# Patient Record
Sex: Female | Born: 1940 | Race: White | Hispanic: Yes | Marital: Married | Smoking: Never smoker
Health system: Southern US, Community
[De-identification: ages and names within clinical notes are randomized; demographics above are authoritative.]

## PROBLEM LIST (undated history)

## (undated) DIAGNOSIS — T7840XA Allergy, unspecified, initial encounter: Secondary | ICD-10-CM

## (undated) DIAGNOSIS — K219 Gastro-esophageal reflux disease without esophagitis: Secondary | ICD-10-CM

## (undated) DIAGNOSIS — I10 Essential (primary) hypertension: Secondary | ICD-10-CM

## (undated) HISTORY — DX: Essential (primary) hypertension: I10

## (undated) HISTORY — DX: Gastro-esophageal reflux disease without esophagitis: K21.9

## (undated) HISTORY — DX: Allergy, unspecified, initial encounter: T78.40XA

## (undated) HISTORY — PX: APPENDECTOMY: SHX54

## (undated) HISTORY — PX: CHOLECYSTECTOMY: SHX55

## (undated) HISTORY — PX: TUBAL LIGATION: SHX77

---

## 2020-10-19 ENCOUNTER — Other Ambulatory Visit: Payer: Self-pay

## 2020-10-20 ENCOUNTER — Ambulatory Visit (INDEPENDENT_AMBULATORY_CARE_PROVIDER_SITE_OTHER): Payer: Medicare Other | Admitting: Family Medicine

## 2020-10-20 ENCOUNTER — Encounter: Payer: Self-pay | Admitting: Family Medicine

## 2020-10-20 VITALS — BP 120/78 | HR 62 | Temp 98.0°F | Ht 58.5 in | Wt 137.5 lb

## 2020-10-20 DIAGNOSIS — I10 Essential (primary) hypertension: Secondary | ICD-10-CM | POA: Diagnosis not present

## 2020-10-20 DIAGNOSIS — Z23 Encounter for immunization: Secondary | ICD-10-CM | POA: Diagnosis not present

## 2020-10-20 NOTE — Progress Notes (Signed)
[facility] [address] [place] [address] Dept: [phone] Dept Fax: [phone]  New Patient Office Visit  Subjective:    Patient ID: Neita Carp, female    DOB: 1941/03/12, 80 y.o..   MRN: [id number]  Chief Complaint  Patient presents with   Establish Care    NP Establish care.  No concerns.     History of Present Illness:  Patient is in today to establish care. Ms. Yeagle is originally from [place]. She lived in [place], [place] most recently. She and her husband moved to [place] 2 months ago to be closer to her kids. They have three children and 8 grandchildren. She is retired from doing office work. She is still looking for how to become more socially engaged in her new community. She denies tobacco use. She drinks wine with dinner and has an occasional cocktail. She denies any drug use.  Ms. Rooks has  a history of essential hypertension. She has noted a gradual increase in the number of medications needed to control her BP, but it has been doing well more recently.  Ms. Sassano had a tetanus shot earlier this year (~ Jan.) She had shingles vaccinations some years ago. She notes she had her last DEXA scan abotu 1.5 to 2 years ago. She also last had blood work around that time. She does not recall ever having a pneumococcal vaccine.  Past Medical History: Patient Active Problem List   Diagnosis Date Noted   Essential hypertension 10/20/2020   Past Surgical History:  Procedure Laterality Date   APPENDECTOMY     CHOLECYSTECTOMY     TUBAL LIGATION Bilateral    Family History  Problem Relation Age of Onset   Heart disease Mother    Diabetes Mother    Cirrhosis Mother    Heart disease Father    Kidney disease Brother    Heart disease Brother    Diabetes Brother    Outpatient Medications Prior to Visit  Medication Sig Dispense Refill   amLODipine (NORVASC) 5 MG tablet Take 1 tablet by mouth daily.      atenolol (TENORMIN) 50 MG tablet Take 50 mg by mouth daily.     benazepril (LOTENSIN) 10 MG tablet Take 10 mg by mouth daily.     No facility-administered medications prior to visit.   No Known Allergies    Objective:   Today's Vitals   10/20/20 1440  BP: 120/78  Pulse: 62  Temp: 98 F (36.7 C)  TempSrc: Temporal  SpO2: 97%  Weight: 137 lb 8 oz (62.4 kg)  Height: 4' 10.5" (1.486 m)   Body mass index is 28.25 kg/m.   General: Well developed, well nourished. No acute distress. Psych: Alert and oriented x3. Normal mood and affect.  Health Maintenance Due  Topic Date Due   TETANUS/TDAP  Never done   Zoster Vaccines- Shingrix (1 of 2) Never done   DEXA SCAN  Never done   PNA vac Low Risk Adult (1 of 2 - PCV13) Never done   COVID-19 Vaccine (4 - Booster for Pfizer series) 07/11/2020     Assessment & Plan:   1. Essential hypertension Ms. Kaylor blood pressure is in good control today. I will continue her on amlodipine, atenolol, and benazepril. I will plan to see her again in 3 months. At that visit, we will plan to screen for fasting lipids and diabetes. We will consider referral for DEXA scanning. We did discuss potentially continuing colorectal cancer  screening, but she declines today.  Loyola Mast, MD

## 2020-10-31 ENCOUNTER — Telehealth: Payer: Self-pay

## 2020-10-31 MED ORDER — BENAZEPRIL HCL 10 MG PO TABS: 10.0000 mg | ORAL_TABLET | Freq: Every day | ORAL | 3 refills | Status: DC

## 2020-10-31 MED ORDER — AMLODIPINE BESYLATE 5 MG PO TABS: 5.0000 mg | ORAL_TABLET | Freq: Every day | ORAL | 3 refills | Status: DC

## 2020-10-31 MED ORDER — ATENOLOL 50 MG PO TABS: 50.0000 mg | ORAL_TABLET | Freq: Every day | ORAL | 3 refills | Status: DC

## 2020-10-31 NOTE — Telephone Encounter (Signed)
please review refill request and advise.  Thanks.   Dm/cma

## 2020-10-31 NOTE — Telephone Encounter (Signed)
New message    1. Which medications need to be refilled? (please list name of each medication and dose if known)  amLODipine (NORVASC) 5 MG tablet  atenolol (TENORMIN) 50 MG tablet  benazepril (LOTENSIN) 10 MG tablet  2. Which pharmacy/location (including street and city if local pharmacy) is medication to be sent to?[facility] - [place], [place] - [address]  3. Do they need a 30 day or 90 day supply? 3 months supply

## 2020-11-01 NOTE — Telephone Encounter (Signed)
Lft detailed VM that RX's were sent to mail order. Dm/cma

## 2021-01-19 ENCOUNTER — Encounter: Payer: Self-pay | Admitting: Family Medicine

## 2021-01-19 ENCOUNTER — Ambulatory Visit (INDEPENDENT_AMBULATORY_CARE_PROVIDER_SITE_OTHER): Payer: Medicare Other | Admitting: Family Medicine

## 2021-01-19 ENCOUNTER — Other Ambulatory Visit: Payer: Self-pay

## 2021-01-19 VITALS — BP 140/78 | HR 55 | Temp 97.4°F | Ht 58.5 in | Wt 137.4 lb

## 2021-01-19 DIAGNOSIS — M1712 Unilateral primary osteoarthritis, left knee: Secondary | ICD-10-CM | POA: Diagnosis not present

## 2021-01-19 DIAGNOSIS — I10 Essential (primary) hypertension: Secondary | ICD-10-CM | POA: Diagnosis not present

## 2021-01-19 NOTE — Progress Notes (Signed)
  [facility] [address] [place] [address] Dept: [phone] Dept Fax: [phone]  Chronic Care Office Visit  Subjective:    Patient ID: Frances Hogan, female    DOB: 01-18-1941, 80 y.o..   MRN: [id number]  Chief Complaint  Patient presents with   Follow-up    3 mo f/u HTN. At home BP range 107/70-123/80    History of Present Illness:  Patient is in today for reassessment of chronic medical issues.  Frances Hogan has a history of hypertension. She is currently managed on amlodipine, atenolol, and benazepril. Her home blood pressures range 107-123/70-80. She tries to remain active, typically walking up to 2 miles a day and [facility] [employer] She has been noticing some pain in her left knee over the past couple of months. She takes Aleve, which helps, but has not resolved the issue. She denies any past trauma to the knee.  Past Medical History: Patient Active Problem List   Diagnosis Date Noted   Essential hypertension 10/20/2020   Past Surgical History:  Procedure Laterality Date   APPENDECTOMY     CHOLECYSTECTOMY     TUBAL LIGATION Bilateral    Family History  Problem Relation Age of Onset   Heart disease Mother    Diabetes Mother    Cirrhosis Mother    Heart disease Father    Kidney disease Brother    Heart disease Brother    Diabetes Brother    Outpatient Medications Prior to Visit  Medication Sig Dispense Refill   amLODipine (NORVASC) 5 MG tablet Take 1 tablet (5 mg total) by mouth daily. 90 tablet 3   atenolol (TENORMIN) 50 MG tablet Take 1 tablet (50 mg total) by mouth daily. 90 tablet 3   benazepril (LOTENSIN) 10 MG tablet Take 1 tablet (10 mg total) by mouth daily. 90 tablet 3   No facility-administered medications prior to visit.   No Known Allergies    Objective:   Today's Vitals   01/19/21 1109  BP: 140/78  Pulse: (!) 55  Temp: (!) 97.4 F (36.3 C)  TempSrc: Temporal   SpO2: 97%  Weight: 137 lb 6.4 oz (62.3 kg)  Height: 4' 10.5" (1.486 m)   Body mass index is 28.23 kg/m.   General: Well developed, well nourished. No acute distress. Extremities: The left knee is mildly enlarged compared to the right. There is no increased warmth. Full ROM   with mild popping noted laterally. Varus/valgus testing and Lochman's are negative. McMurray's testing   produces both medial and lateral popping.  Psych: Alert and oriented x3. Normal mood and affect.  Health Maintenance Due  Topic Date Due   DEXA SCAN  Never done   PNA vac Low Risk Adult (1 of 2 - PCV13) 06/18/2004   COVID-19 Vaccine (4 - Booster for Pfizer series) 07/11/2020   INFLUENZA VACCINE  Never done     Assessment & Plan:   1. Essential hypertension Blood pressure is good at home. We will continue her on amlodipine, atenolol, and benazepril. I will reassess her in 3 months.  2. Arthritis of left knee Frances Hogan likely has some osteoarthritis with possible meniscal issues. I will refer her to sports medicine for an assessment and management options. She is not interested in surgery.  - Ambulatory referral to Sports Medicine  Loyola Mast, MD

## 2021-01-22 ENCOUNTER — Ambulatory Visit: Payer: Medicare Other | Admitting: Family Medicine

## 2021-01-22 NOTE — Progress Notes (Signed)
Subjective:    CC: L knee pain  I, Frances Hogan, LAT, ATC, am serving as scribe for Dr. Clementeen Graham.  HPI: Pt is an 80 y/o female presenting w/ c/o chronic L knee pain x 5 years that has been worsening over the last 2 years.  She locates her pain to her L medial knee.  Her goals are to continue to walk for exercise and to stay fit and active.  She would like to avoid a knee replacement as her siblings have had one and not done well.  L knee swelling: yes L knee mechanical symptoms: no Aggravating factors: walking; worsens throughout the day Treatments tried: Aleve; knee sleeve; Tylenol; Osteobiflex  Pertinent review of Systems: No fevers or chills  Relevant historical information: Hypertension   Objective:    Vitals:   01/23/21 0921  BP: (!) 144/82  Pulse: (!) 58  SpO2: 97%   General: Well Developed, well nourished, and in no acute distress.   MSK: Left knee mild knee effusion bossing medial joint line Normal knee motion with crepitation.   Slight laxity MCL stress test. Intact strength. Negative McMurray's test.   Lab and Radiology Results  Diagnostic Limited MSK Ultrasound of: Left knee Quad tendon intact normal-appearing Trace joint effusion present. Patellar tendon normal-appearing Medial joint line narrowed degenerative with extruded medial meniscus. Lateral joint line normal. Posterior knee no Baker's cyst. 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  X-ray images left knee obtained today personally and independently interpreted Moderate medial compartment DJD.  Mild patellofemoral DJD.  Chondrocalcinosis present. Await formal radiology review   Impression and Recommendations:    Assessment and Plan: 80 y.o. female with left knee pain primarily due to medial compartment DJD.  Patient does have some chondrocalcinosis on x-ray however pseudogout I think is pretty unlikely.  Believe source of chondrocalcinosis is due to  the DJD.  Discussed treatment plan and options.  Fundamentally she would like to pursue further conservative management strategies.  Plan to use Tylenol arthritis especially in the evening when her knee starts to get more painful predictably, Voltaren gel, and compression knee sleeve.  We discussed the potential of a medial off loader knee brace which should help her medial compartment DJD and the instability seen on exam.  She would like to avoid a big bulky knee brace for now.  Additionally quad strengthening should be helpful.  She like to work on that on her own but physical therapy is an option in the future.  Additionally we discussed steroid knee injections and hyaluronic acid injections.  She would like to delay these as well.  Lastly knee replacement is certainly an option at some point.  She is not ready to consider this.  Check back with me as needed.  Certainly more to offer in the future.  Fundamentally she seems to be in pretty good shape and I think will do well.Marland Kitchen  PDMP not reviewed this encounter. Orders Placed This Encounter  Procedures   [place] LIMITED JOINT SPACE STRUCTURES LOW LEFT(NO LINKED CHARGES)    Order Specific Question:   Reason for Exam (SYMPTOM  OR DIAGNOSIS REQUIRED)    Answer:   L knee pain    Order Specific Question:   Preferred imaging location?    Answer:   [facility] Knee AP/LAT W/[place] Left    Standing Status:   Future    Number of Occurrences:   1    Standing Expiration Date:  02/22/2021    Order Specific Question:   Reason for Exam (SYMPTOM  OR DIAGNOSIS REQUIRED)    Answer:   L knee pain    Order Specific Question:   Preferred imaging location?    Answer:   [facility]   No orders of the defined types were placed in this encounter.   Discussed warning signs or symptoms. Please see discharge instructions. Patient expresses understanding.   The above documentation has been reviewed and is accurate and complete  Clementeen Graham, M.D.

## 2021-01-23 ENCOUNTER — Ambulatory Visit (INDEPENDENT_AMBULATORY_CARE_PROVIDER_SITE_OTHER): Payer: Medicare Other

## 2021-01-23 ENCOUNTER — Ambulatory Visit (INDEPENDENT_AMBULATORY_CARE_PROVIDER_SITE_OTHER): Payer: Medicare Other | Admitting: Family Medicine

## 2021-01-23 ENCOUNTER — Encounter: Payer: Self-pay | Admitting: Family Medicine

## 2021-01-23 ENCOUNTER — Other Ambulatory Visit: Payer: Self-pay

## 2021-01-23 ENCOUNTER — Ambulatory Visit: Payer: Self-pay

## 2021-01-23 VITALS — BP 144/82 | HR 58 | Ht 58.5 in | Wt 138.4 lb

## 2021-01-23 DIAGNOSIS — G8929 Other chronic pain: Secondary | ICD-10-CM

## 2021-01-23 DIAGNOSIS — M25562 Pain in left knee: Secondary | ICD-10-CM

## 2021-01-23 DIAGNOSIS — M1712 Unilateral primary osteoarthritis, left knee: Secondary | ICD-10-CM | POA: Insufficient documentation

## 2021-01-23 DIAGNOSIS — M11262 Other chondrocalcinosis, left knee: Secondary | ICD-10-CM | POA: Diagnosis not present

## 2021-01-23 NOTE — Patient Instructions (Signed)
Nice to meet you today.  Please get an Xray today before you leave.   Please use Voltaren gel (Generic Diclofenac Gel) up to 4x daily for pain as needed.  This is available over-the-counter as both the name brand Voltaren gel and the generic diclofenac gel.   Take Tylenol arthritis.  Stay active.  Follow-up as needed.

## 2021-01-24 NOTE — Progress Notes (Signed)
Left knee x-ray shows medium arthritis changes in the knee.

## 2021-04-09 ENCOUNTER — Telehealth: Payer: Self-pay | Admitting: Family Medicine

## 2021-04-09 NOTE — Telephone Encounter (Signed)
Left message for patient to call back to schedule Medicare Annual Wellness Visit   No hx of AWV eligible as of 12/04/20  Please schedule at anytime with [facility] Health Advisor if patient calls the office back.    45 Minutes appointment   Any questions, please call me at [phone]

## 2021-04-19 ENCOUNTER — Other Ambulatory Visit: Payer: Self-pay

## 2021-04-20 ENCOUNTER — Ambulatory Visit (INDEPENDENT_AMBULATORY_CARE_PROVIDER_SITE_OTHER): Payer: Medicare Other | Admitting: Family Medicine

## 2021-04-20 VITALS — BP 130/72 | HR 53 | Temp 96.8°F | Ht <= 58 in | Wt 140.0 lb

## 2021-04-20 DIAGNOSIS — I10 Essential (primary) hypertension: Secondary | ICD-10-CM | POA: Diagnosis not present

## 2021-04-20 DIAGNOSIS — G8929 Other chronic pain: Secondary | ICD-10-CM | POA: Diagnosis not present

## 2021-04-20 DIAGNOSIS — M25562 Pain in left knee: Secondary | ICD-10-CM | POA: Diagnosis not present

## 2021-04-20 NOTE — Progress Notes (Signed)
°  [facility] [address] [place] [address] Dept: [phone] Dept Fax: [phone]  Chronic Care Office Visit  Subjective:    Patient ID: Frances Hogan, female    DOB: 1940/12/25, 80 y.o..   MRN: [id number]  Chief Complaint  Patient presents with   Follow-up    3 mth f/u. No other concerns     History of Present Illness:  Patient is in today for reassessment of chronic medical issues.  Frances Hogan has a history of hypertension. She is currently managed on amlodipine, atenolol, and benazepril. She continues to go for walks, but finds her left knee gives her trouble with this. She was seen by Dr. Denyse Amass, who recommended that we monitor this for now.   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 90 tablet 3   atenolol (TENORMIN) 50 MG tablet Take 1 tablet (50 mg total) by mouth daily. 90 tablet 3   benazepril (LOTENSIN) 10 MG tablet Take 1 tablet (10 mg total) by mouth daily. 90 tablet 3   No facility-administered medications prior to visit.   No Known Allergies    Objective:   Today's Vitals   04/20/21 0940  BP: 130/72  Pulse: (!) 53  Temp: (!) 96.8 F (36 C)  SpO2: 97%  Weight: 140 lb (63.5 kg)   Height: 4\' 10"  (1.473 m)   Body mass index is 29.26 kg/m.   General: Well developed, well nourished. No acute distress. Psych: Alert and oriented. Normal mood and affect.  Health Maintenance Due  Topic Date Due   COVID-19 Vaccine (4 - Booster for Pfizer series) 05/08/2020     Assessment & Plan:   1. Essential hypertension Blood pressure remains at goal. I will continue amlodipine, atenolol, and benazepril. Follow-up in 4 months.  2. Chronic pain of left knee Stable. We discussed alternative approaches to exercise (such as pool exercises), but she is not interested in that.  07/06/2020, MD

## 2021-05-25 ENCOUNTER — Telehealth: Payer: Self-pay | Admitting: Family Medicine

## 2021-05-25 NOTE — Telephone Encounter (Signed)
Left message for patient to call back and schedule Medicare Annual Wellness Visit (AWV) in office.  ° °If not able to come in office, please offer to do virtually or by telephone.  Left office number and my jabber #[phone]. ° °Due for AWVI ° °Please schedule at anytime with Nurse Health Advisor. °  °

## 2021-08-08 ENCOUNTER — Ambulatory Visit: Payer: Medicare Other

## 2022-01-08 ENCOUNTER — Other Ambulatory Visit: Payer: Self-pay | Admitting: Family Medicine

## 2022-03-04 ENCOUNTER — Telehealth: Payer: Self-pay | Admitting: Family Medicine

## 2022-03-04 NOTE — Telephone Encounter (Signed)
Left VM to rtn call to schedule on appointment.   LOV 04/20/21, no Future OV's scheduled. Dm/cma

## 2022-03-04 NOTE — Telephone Encounter (Signed)
Caller Name: Latiya Call back phone #: [phone]   MEDICATION(S):  amLODipine,atenolol, and benazepril  Days of Med Remaining:   Has the patient contacted their pharmacy (YES/NO)? no What did pharmacy advise?   Preferred Pharmacy:  [facility] [place] Phone:  [phone]      ~~~Please advise patient/caregiver to allow 2-3 business days to process RX refills.

## 2022-03-04 NOTE — Telephone Encounter (Signed)
Caller Name: pt Call back phone #: [phone]   MEDICATION(S):  amLODipine (NORVASC) 5 MG tablet [[id number]], atenolol (TENORMIN) 50 MG tablet [[id number]] and benazepril (LOTENSIN) 10 MG tablet [[id number] Days of Med Remaining: 7 days left   Has the patient contacted their pharmacy (YES/NO)? Yes contact your PCP   Preferred Pharmacy:  [facility] [place]  [address], [place] [address]  Phone:  [phone]  Fax:  [phone]

## 2022-03-04 NOTE — Telephone Encounter (Signed)
Left VM to rtn call to schedule on appointment.   LOV 04/20/21, no Future OV's scheduled. Dm/cma 

## 2022-03-07 MED ORDER — BENAZEPRIL HCL 10 MG PO TABS: 10.0000 mg | ORAL_TABLET | Freq: Every day | ORAL | 0 refills | Status: DC

## 2022-03-07 MED ORDER — ATENOLOL 50 MG PO TABS: 50.0000 mg | ORAL_TABLET | Freq: Every day | ORAL | 0 refills | Status: DC

## 2022-03-07 MED ORDER — AMLODIPINE BESYLATE 5 MG PO TABS: 5.0000 mg | ORAL_TABLET | Freq: Every day | ORAL | 0 refills | Status: DC

## 2022-03-07 NOTE — Telephone Encounter (Signed)
Patient  cam into the office and already has an appointment scheduled for 03/12/22.  30 days worth of medication sent to the [facility] for her.  Dm/cma

## 2022-03-07 NOTE — Addendum Note (Signed)
Addended by: Konrad Saha on: 03/07/2022 12:03 PM   Modules accepted: Orders

## 2022-03-12 ENCOUNTER — Encounter: Payer: Self-pay | Admitting: Family Medicine

## 2022-03-12 ENCOUNTER — Ambulatory Visit (INDEPENDENT_AMBULATORY_CARE_PROVIDER_SITE_OTHER): Payer: Medicare Other | Admitting: Family Medicine

## 2022-03-12 VITALS — BP 116/68 | HR 69 | Temp 97.1°F | Ht <= 58 in | Wt 142.4 lb

## 2022-03-12 DIAGNOSIS — M1712 Unilateral primary osteoarthritis, left knee: Secondary | ICD-10-CM

## 2022-03-12 DIAGNOSIS — I10 Essential (primary) hypertension: Secondary | ICD-10-CM

## 2022-03-12 MED ORDER — AMLODIPINE BESYLATE 5 MG PO TABS: 5.0000 mg | ORAL_TABLET | Freq: Every day | ORAL | 3 refills | Status: DC

## 2022-03-12 MED ORDER — ATENOLOL 50 MG PO TABS: 50.0000 mg | ORAL_TABLET | Freq: Every day | ORAL | 3 refills | Status: DC

## 2022-03-12 MED ORDER — BENAZEPRIL HCL 10 MG PO TABS: 10.0000 mg | ORAL_TABLET | Freq: Every day | ORAL | 3 refills | Status: DC

## 2022-03-12 NOTE — Progress Notes (Signed)
  [facility] [address] [place] [address] Dept: [phone] Dept Fax: [phone]  Chronic Care Office Visit  Subjective:    Patient ID: Frances Hogan, female    DOB: 1940-09-26, 81 y.o..   MRN: [id number]  Chief Complaint  Patient presents with   Follow-up    F/u meds. No concerns.     History of Present Illness:  Patient is in today for reassessment of chronic medical issues.  Frances Hogan has a history of hypertension. She is currently managed on amlodipine 5 mg daily, atenolol 50 mg daily, and benazepril 10 mg daily.   Frances Hogan has a history of left knee osteoarthritis. She was seen by Dr. Georgina Snell, who recommended that we monitor this for now.  She feels that this is not worse over the last year.  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 30 tablet 0   atenolol (TENORMIN) 50 MG tablet Take 1 tablet (50 mg total) by mouth daily. 30 tablet 0   benazepril (LOTENSIN) 10 MG tablet Take 1 tablet (10 mg total) by mouth daily. 30 tablet 0   No facility-administered medications prior to visit.   No Known Allergies    Objective:   Today's Vitals   03/12/22 1342  BP: 116/68  Pulse: 69  Temp: (!) 97.1 F (36.2 C)  TempSrc: Temporal  SpO2: 95%   Weight: 142 lb 6.4 oz (64.6 kg)  Height: 4\' 10"  (1.473 m)   Body mass index is 29.76 kg/m.   General: Well developed, well nourished. No acute distress. Psych: Alert and oriented. Normal mood and affect.  Health Maintenance Due  Topic Date Due   Medicare Annual Wellness (AWV)  Never done     Assessment & Plan:   1. Essential hypertension Frances Hogan blood pressure remains in excellent control. We will continue her current meds.  - amLODipine (NORVASC) 5 MG tablet; Take 1 tablet (5 mg total) by mouth daily.  Dispense: 90 tablet; Refill: 3 - atenolol (TENORMIN) 50 MG tablet; Take 1 tablet (50 mg total) by mouth daily.  Dispense: 90 tablet; Refill: 3 - benazepril (LOTENSIN) 10 MG tablet; Take 1 tablet (10 mg total) by mouth daily.  Dispense: 90 tablet; Refill: 3  2. Primary osteoarthritis of left knee Frances Hogan is remaining active and tolerating her arthritic complaints well.  Return in about 1 year (around 03/13/2023).   Haydee Salter, MD

## 2022-07-02 ENCOUNTER — Ambulatory Visit (INDEPENDENT_AMBULATORY_CARE_PROVIDER_SITE_OTHER): Payer: Medicare Other | Admitting: Family Medicine

## 2022-07-02 ENCOUNTER — Encounter: Payer: Self-pay | Admitting: Family Medicine

## 2022-07-02 VITALS — BP 136/80 | HR 64 | Temp 97.6°F | Ht <= 58 in | Wt 143.6 lb

## 2022-07-02 DIAGNOSIS — I10 Essential (primary) hypertension: Secondary | ICD-10-CM | POA: Diagnosis not present

## 2022-07-02 DIAGNOSIS — Z13220 Encounter for screening for lipoid disorders: Secondary | ICD-10-CM

## 2022-07-02 DIAGNOSIS — L309 Dermatitis, unspecified: Secondary | ICD-10-CM | POA: Diagnosis not present

## 2022-07-02 NOTE — Assessment & Plan Note (Signed)
Blood pressure is mildly elevated today, which has not been her norm. I will monitor this for now, before considering a dosage change. I will check her renal function.  She will continue amlodipine 5 mg daily, atenolol 50 mg daily, and benazepril 10 mg daily.

## 2022-07-02 NOTE — Progress Notes (Signed)
[facility] [address] [place] [address] Dept: [phone] Dept Fax: [phone]  Chronic Care Office Visit  Subjective:    Patient ID: Frances Hogan, female    DOB: 1940/07/02, 82 y.o..   MRN: [id number]  Chief Complaint  Patient presents with   Rash    C/o having a rash on legs x 1 month.      History of Present Illness:  Patient is in today for reassessment of chronic medical issues.  [name] has a history of hypertension. She is currently managed on amlodipine 5 mg daily, atenolol 50 mg daily, and benazepril 10 mg daily.    [name] notes that she ahs a rash that developed over the past month on her lower legs. This has not been pruritic. She is concerned about the appearance of the rash.  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 90 tablet 3   atenolol (TENORMIN) 50 MG tablet Take 1 tablet (50 mg total) by mouth daily. 90 tablet 3   benazepril (LOTENSIN) 10 MG tablet Take 1 tablet (10 mg total) by mouth daily. 90 tablet 3   No facility-administered medications prior to visit.   No Known Allergies Objective:   Today's Vitals   07/02/22 1326  BP: 136/80  Pulse: 64  Temp: 97.6 F (36.4 C)  TempSrc: Temporal  SpO2: 98%   Weight: 143 lb 9.6 oz (65.1 kg)  Height: '4\' 10"'$  (1.473 m)   Body mass index is 30.01 kg/m.   General: Well developed, well nourished. No acute distress. Skin: Warm and dry. There is a region around both ankles with brown, speckled rash. There is no erythema and these areas are   non-blanchable. Neuro: CN II-XII intact. Normal sensation and DTR bilaterally. Psych: Alert and oriented. Normal mood and affect.  Health Maintenance Due  Topic Date Due   Medicare Annual Wellness (AWV)  Never done   DEXA SCAN  Never done     Assessment & Plan:   Problem List Items Addressed This Visit       Cardiovascular and Mediastinum   Essential hypertension - Primary    Blood pressure is mildly elevated today, which has not been her norm. I will monitor this for now, before considering a dosage change. I will check her renal function.  She will continue amlodipine 5 mg daily, atenolol 50 mg daily, and benazepril 10 mg daily.      Relevant Orders   Basic metabolic panel   Other Visit Diagnoses     Dermatitis       I suspect this is an age-related hyperpigmentation. However, in light of her concerns for the cosmetic appearance, I will refer her to dermatology.  Relevant Orders   Ambulatory referral to Dermatology   Screening for lipid disorders       Relevant Orders   Lipid panel       Return in about 3 months (around 09/30/2022) for Reassessment.   Haydee Salter, MD

## 2022-07-09 ENCOUNTER — Other Ambulatory Visit: Payer: Medicare Other

## 2022-07-09 ENCOUNTER — Telehealth: Payer: Self-pay | Admitting: Family Medicine

## 2022-07-09 LAB — BASIC METABOLIC PANEL
BUN: 13 mg/dL (ref 6–23)
CO2: 30 mEq/L (ref 19–32)
Calcium: 9.7 mg/dL (ref 8.4–10.5)
Chloride: 102 mEq/L (ref 96–112)
Creatinine, Ser: 0.71 mg/dL (ref 0.40–1.20)
GFR: 78.83 mL/min (ref >60.00)
Glucose, Bld: 89 mg/dL (ref 70–99)
Potassium: 4.6 mEq/L (ref 3.5–5.1)
Sodium: 138 mEq/L (ref 135–145)

## 2022-07-09 LAB — LIPID PANEL
Cholesterol: 215 mg/dL — ABNORMAL HIGH (ref 0–200)
HDL: 73.4 mg/dL (ref >39.00)
LDL Cholesterol: 114 mg/dL — ABNORMAL HIGH (ref 0–99)
NonHDL: 141.43
Total CHOL/HDL Ratio: 3
Triglycerides: 137 mg/dL (ref 0.0–149.0)
VLDL: 27.4 mg/dL (ref 0.0–40.0)

## 2022-07-09 NOTE — Telephone Encounter (Signed)
I called pt to let her know her referral is  [facility] Dermatology Department Contact Information   Phone Fax Address [phone] [address] [place] [address]

## 2022-08-22 ENCOUNTER — Ambulatory Visit: Payer: Medicare Other | Admitting: Dermatology

## 2022-09-15 IMAGING — DX DG KNEE AP/LAT W/ SUNRISE*L*
3 series · 3 of 3 positions shown · non-contrast
Comparison: None.

CLINICAL DATA: Left knee pain.

EXAM:
LEFT KNEE 3 VIEWS

[knee ap]
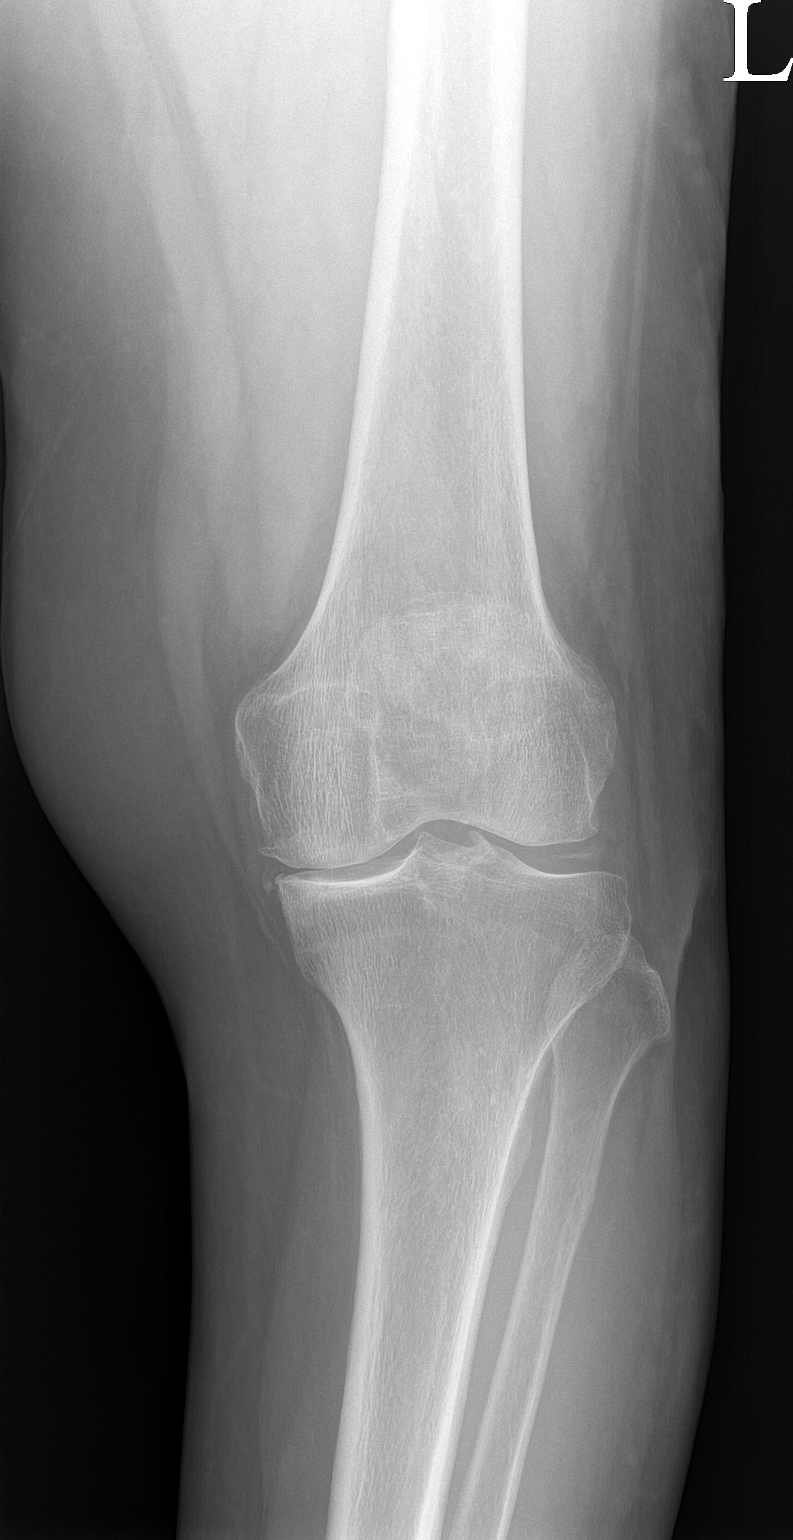

[knee lat]
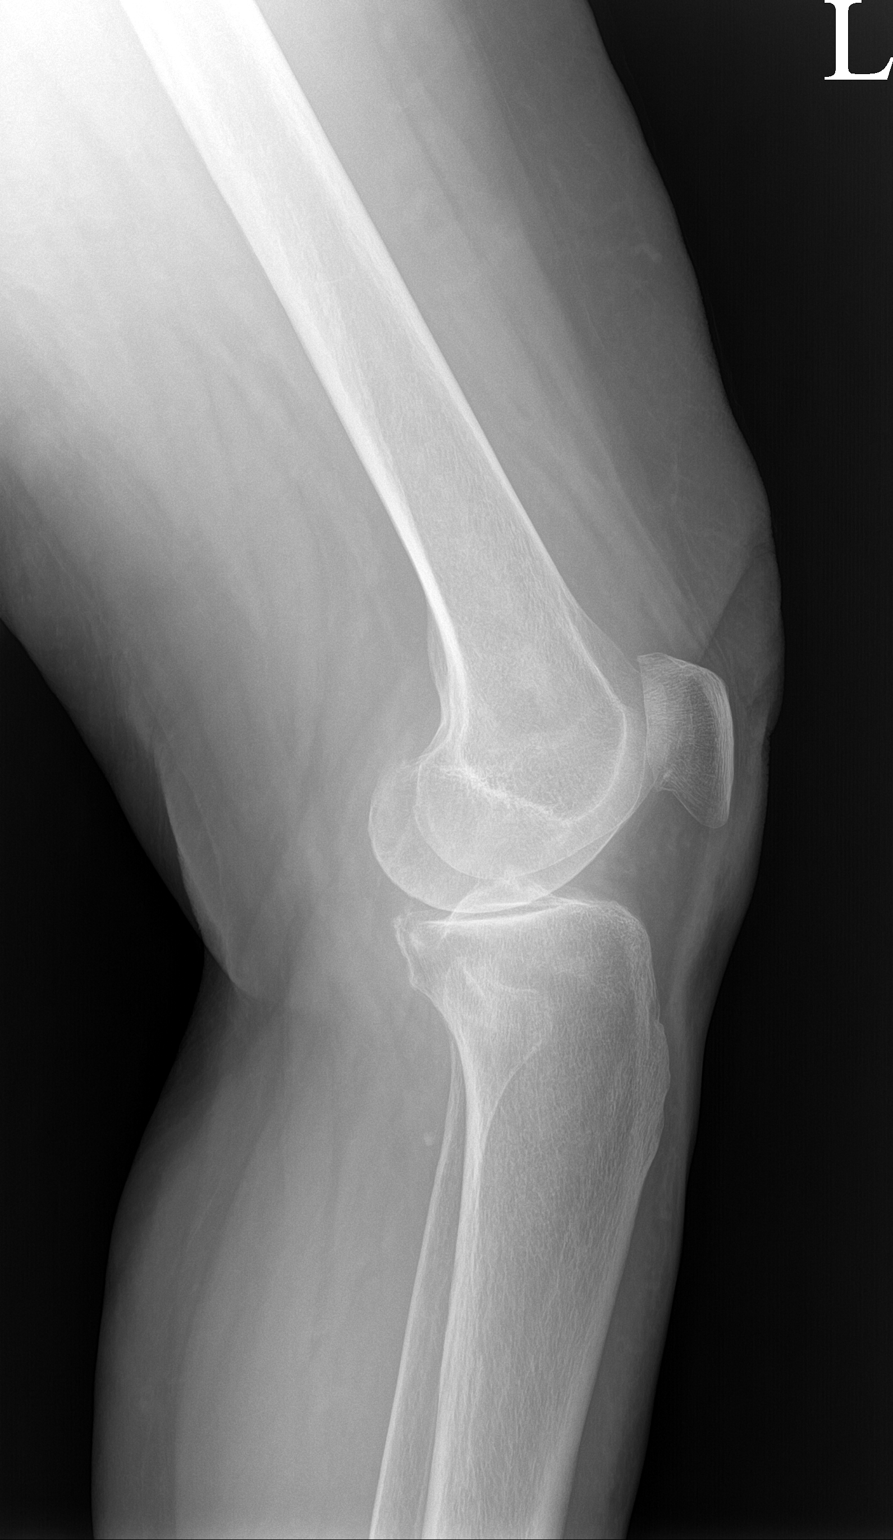

[patella]
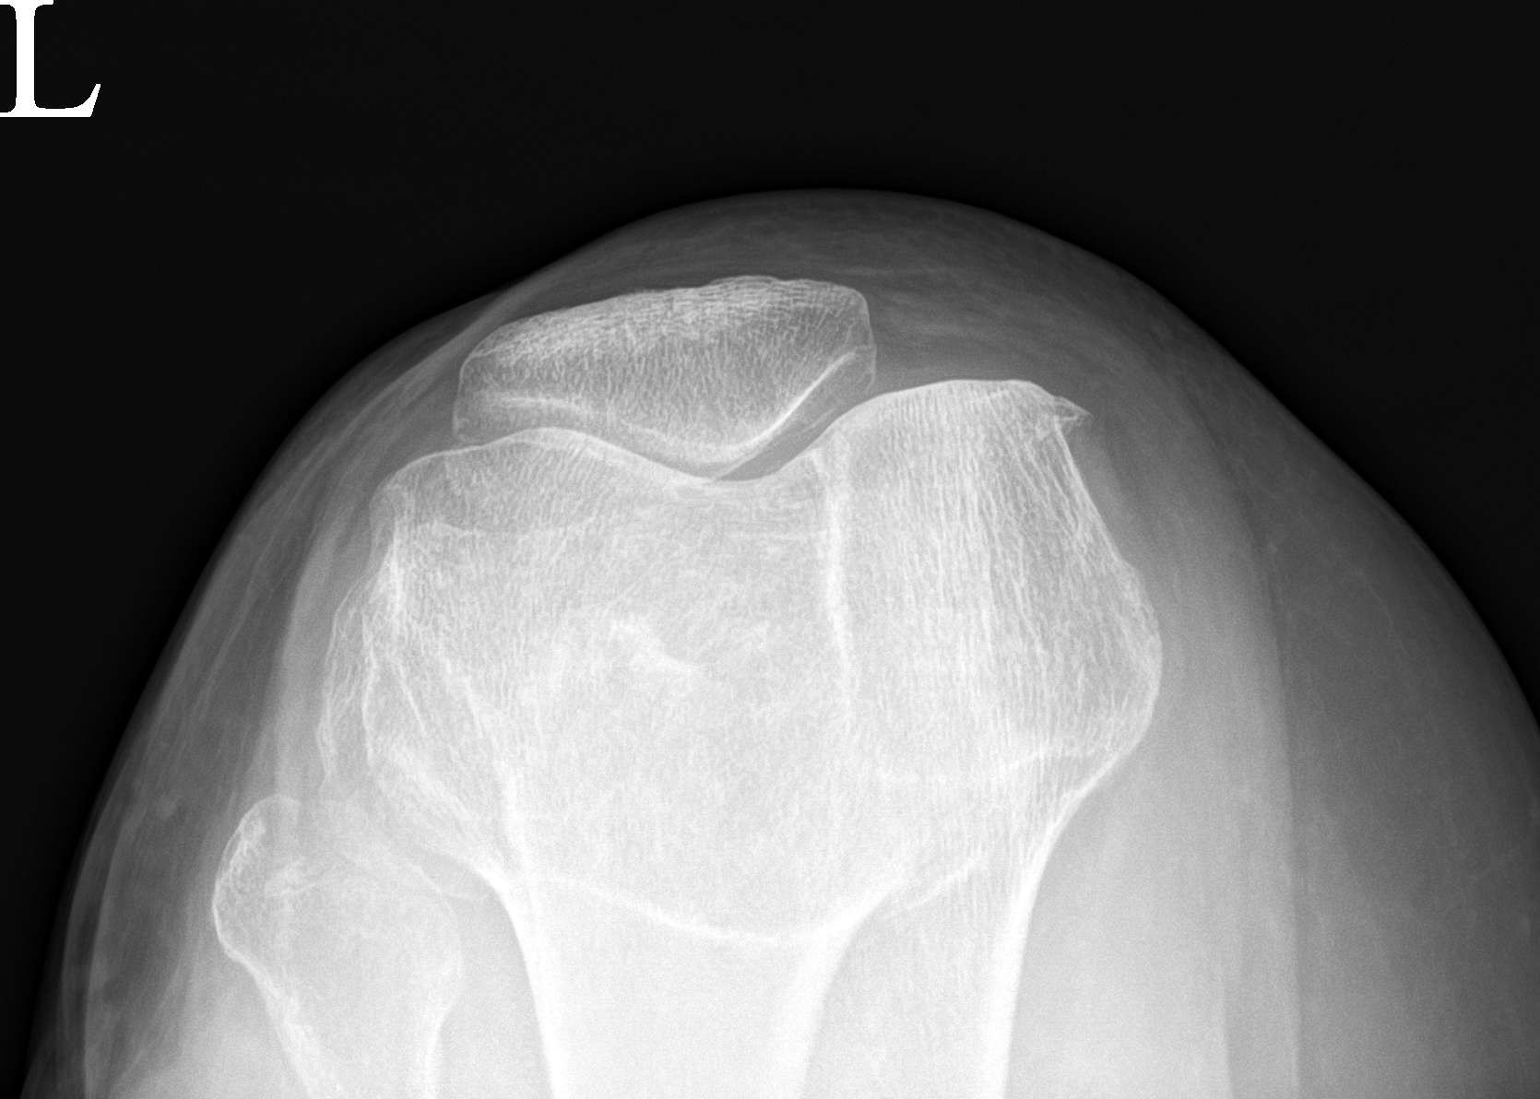

[3 of 3 positions shown; findings below may reference images not displayed]

FINDINGS: There is no acute fracture or dislocation. The bones are osteopenic.
Osteoarthritic changes with moderate narrowing of the medial
compartment and spurring. Meniscal chondrocalcinosis. Small
suprapatellar effusion. The soft tissues are unremarkable.
IMPRESSION: 1. No acute fracture or dislocation.
2. Osteopenia with osteoarthritic changes.

## 2022-09-26 ENCOUNTER — Ambulatory Visit (INDEPENDENT_AMBULATORY_CARE_PROVIDER_SITE_OTHER): Payer: Medicare Other | Admitting: Dermatology

## 2022-09-26 ENCOUNTER — Encounter: Payer: Self-pay | Admitting: Dermatology

## 2022-09-26 DIAGNOSIS — I872 Venous insufficiency (chronic) (peripheral): Secondary | ICD-10-CM

## 2022-09-26 DIAGNOSIS — L817 Pigmented purpuric dermatosis: Secondary | ICD-10-CM

## 2022-09-26 MED ORDER — TRIAMCINOLONE ACETONIDE 0.1 % EX CREA: 1.0000 | TOPICAL_CREAM | Freq: Two times a day (BID) | CUTANEOUS | 11 refills | Status: AC | PRN

## 2022-09-26 NOTE — Progress Notes (Signed)
   New Patient Visit   Subjective  Myisha Furbush is a 82 y.o. female who presents for the following: Rash  Complains of rash. Started about a month ago. She thinks it is possible triggered by heat or hormones. It is sensitive more than itchy. When it flares, pt puts regular body lotion on it. Not currently flared today. Pt had the same rash about 3 years ago which lasted for about a month  The following portions of the chart were reviewed this encounter and updated as appropriate: medications, allergies, medical history  Review of Systems:  No other skin or systemic complaints except as noted in HPI or Assessment and Plan.  Objective  Well appearing patient in no apparent distress; mood and affect are within normal limits.  A focused examination was performed of the following areas: Bilateral lower legs  Relevant exam findings are noted in the Assessment and Plan.    Assessment & Plan   Shambergs Disease / Purpura  Exam: Has petechiae spots. Brown flat papules on the lower legs  Treatment Plan: -Reassurance -Wear compression stockings when standing for riding in car for long periods of time -Elevate legs when sitting   Venus Statis Dermatitiis  Exam: Not currently flared  Treatment Plan: -Apply Triamcinolone BID for up to two weeks PRM new flare -Pt aware that heat is a primary trigger     Return if symptoms worsen or fail to improve.    Documentation: I have reviewed the above documentation for accuracy and completeness, and I agree with the above.  Langston Reusing, DO   I, Germaine Pomfret, CMA, am acting as scribe for [employer], DO.

## 2022-09-26 NOTE — Progress Notes (Deleted)
   New Patient Visit   Subjective  Frances Hogan is a 82 y.o. female who presents for the following: Skin Rash  Patient states she has {DERM EXAM:21955} located at the {LOCATION ON BODY:21951} that she would like to have examined. Patient reports the areas have been there for {TIME 1:21022249} {gen duration:315003}. She reports the areas *** bothersome. *** states that the areas *** spread. Patient reports *** previously been treated for these areas. Patient *** Hx of bx. Patient *** family history of skin cancer(s).  She was evaluated by her PCP on 07/02/22 and he felt her dermatitis is an age-related hyperpigmentation. No medication were prescribed at this time.  The following portions of the chart were reviewed this encounter and updated as appropriate: medications, allergies, medical history  Review of Systems:  No other skin or systemic complaints except as noted in HPI or Assessment and Plan.  Objective  Well appearing patient in no apparent distress; mood and affect are within normal limits.  A full examination was performed including scalp, head, eyes, ears, nose, lips, neck, chest, axillae, abdomen, back, buttocks, bilateral upper extremities, bilateral lower extremities, hands, feet, fingers, toes, fingernails, and toenails. All findings within normal limits unless otherwise noted below.   A focused examination was performed of the following areas:   Relevant exam findings are noted in the Assessment and Plan.    Assessment & Plan       No follow-ups on file.    Documentation: I have reviewed the above documentation for accuracy and completeness, and I agree with the above.  Langston Reusing, DO

## 2022-09-26 NOTE — Patient Instructions (Signed)
Due to recent changes in healthcare laws, you may see results of your pathology and/or laboratory studies on MyChart before the doctors have had a chance to review them. We understand that in some cases there may be results that are confusing or concerning to you. Please understand that not all results are received at the same time and often the doctors may need to interpret multiple results in order to provide you with the best plan of care or course of treatment. Therefore, we ask that you please give us 2 business days to thoroughly review all your results before contacting the office for clarification. Should we see a critical lab result, you will be contacted sooner.   If You Need Anything After Your Visit  If you have any questions or concerns for your doctor, please call our main line at [phone] If no one answers, please leave a voicemail as directed and we will return your call as soon as possible. Messages left after 4 pm will be answered the following business day.   You may also send us a message via MyChart. We typically respond to MyChart messages within 1-2 business days.  For prescription refills, please ask your pharmacy to contact our office. Our fax number is [phone].  If you have an urgent issue when the clinic is closed that cannot wait until the next business day, you can page your doctor at the number below.    Please note that while we do our best to be available for urgent issues outside of office hours, we are not available 24/7.   If you have an urgent issue and are unable to reach us, you may choose to seek medical care at your doctor's office, retail clinic, urgent care center, or emergency room.  If you have a medical emergency, please immediately call 911 or go to the emergency department. In the event of inclement weather, please call our main line at [phone] for an update on the status of any delays or closures.  Dermatology Medication Tips: Please  keep the boxes that topical medications come in in order to help keep track of the instructions about where and how to use these. Pharmacies typically print the medication instructions only on the boxes and not directly on the medication tubes.   If your medication is too expensive, please contact our office at [phone] or send us a message through MyChart.   We are unable to tell what your co-pay for medications will be in advance as this is different depending on your insurance coverage. However, we may be able to find a substitute medication at lower cost or fill out paperwork to get insurance to cover a needed medication.   If a prior authorization is required to get your medication covered by your insurance company, please allow us 1-2 business days to complete this process.  Drug prices often vary depending on where the prescription is filled and some pharmacies may offer cheaper prices.  The website www.goodrx.com contains coupons for medications through different pharmacies. The prices here do not account for what the cost may be with help from insurance (it may be cheaper with your insurance), but the website can give you the price if you did not use any insurance.  - You can print the associated coupon and take it with your prescription to the pharmacy.  - You may also stop by our office during regular business hours and pick up a GoodRx coupon card.  - If you need your   prescription sent electronically to a different pharmacy, notify our office through [facility] MyChart or by phone at [phone]     

## 2022-10-01 ENCOUNTER — Encounter: Payer: Self-pay | Admitting: Family Medicine

## 2022-10-01 ENCOUNTER — Ambulatory Visit (INDEPENDENT_AMBULATORY_CARE_PROVIDER_SITE_OTHER): Payer: Medicare Other | Admitting: Family Medicine

## 2022-10-01 VITALS — BP 114/66 | HR 67 | Temp 98.3°F | Ht <= 58 in | Wt 141.4 lb

## 2022-10-01 DIAGNOSIS — I10 Essential (primary) hypertension: Secondary | ICD-10-CM

## 2022-10-01 DIAGNOSIS — L817 Pigmented purpuric dermatosis: Secondary | ICD-10-CM | POA: Insufficient documentation

## 2022-10-01 NOTE — Addendum Note (Signed)
Addended by: Loyola Mast on: 10/01/2022 02:48 PM   Modules accepted: Level of Service

## 2022-10-01 NOTE — Assessment & Plan Note (Signed)
Reviewed and reinforced recommendations regarding this rash.

## 2022-10-01 NOTE — Progress Notes (Signed)
  [facility] [address] [place] [address] Dept: [phone] Dept Fax: [phone]  Chronic Care Office Visit  Subjective:    Patient ID: Frances Hogan, female    DOB: 1940-06-26, 82 y.o..   MRN: [id number]  Chief Complaint  Patient presents with   Medical Management of Chronic Issues    3 month f/u.     History of Present Illness:  Patient is in today for reassessment of chronic medical issues.  [name] has a history of hypertension. She is currently managed on amlodipine 5 mg daily, atenolol 50 mg daily, and benazepril 10 mg daily.     At her last visit, [name] noted a rash on her lower legs. I had referred ehr to dermatology. She was recently seen and felt to Schamberg's disease (progressive pigmentary purpura). She has been prescribed TAC cream.  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 90 tablet 3   atenolol (TENORMIN) 50 MG tablet Take 1 tablet (50 mg total) by mouth daily. 90 tablet 3   benazepril (LOTENSIN) 10 MG tablet Take 1 tablet (10 mg total) by mouth daily. 90 tablet 3   triamcinolone cream (KENALOG) 0.1 % Apply 1 Application topically 2 (two) times daily as needed. Apply topically twice a day  for up to two weeks 240 g 11   No facility-administered medications prior to visit.   No Known Allergies Objective:   Today's Vitals   10/01/22 1404  BP: 114/66  Pulse: 67  Temp: 98.3 F (36.8 C)  TempSrc: Temporal  SpO2: 98%  Weight: 141 lb 6.4 oz (64.1 kg)  Height: 4\' 10"  (1.473 m)   Body mass index is 29.55 kg/m.   General: Well developed, well nourished. No acute distress. Psych: Alert and oriented. Normal mood and affect.  Health Maintenance Due  Topic Date Due   Medicare Annual Wellness (AWV)  Never done     Assessment & Plan:   Problem List Items Addressed This Visit       Cardiovascular and Mediastinum   Essential hypertension - Primary    Blood pressure is at goal. Continue amlodipine 5 mg daily, atenolol 50 mg daily, and benazepril 10 mg daily.      Schamberg's disease    Reviewed and reinforced recommendations regarding this rash.       Return in about 6 months (around 04/03/2023) for Reassessment.   Loyola Mast, MD

## 2022-10-01 NOTE — Assessment & Plan Note (Signed)
Blood pressure is at goal. Continue amlodipine 5 mg daily, atenolol 50 mg daily, and benazepril 10 mg daily.

## 2022-10-21 ENCOUNTER — Ambulatory Visit: Payer: Medicare Other

## 2023-01-31 ENCOUNTER — Ambulatory Visit (INDEPENDENT_AMBULATORY_CARE_PROVIDER_SITE_OTHER): Payer: Medicare Other

## 2023-01-31 VITALS — BP 130/62 | HR 65 | Temp 98.0°F | Ht 59.0 in | Wt 140.0 lb

## 2023-01-31 DIAGNOSIS — Z0000 Encounter for general adult medical examination without abnormal findings: Secondary | ICD-10-CM | POA: Diagnosis not present

## 2023-01-31 NOTE — Patient Instructions (Signed)
[name] , Thank you for taking time to come for your Medicare Wellness Visit. I appreciate your ongoing commitment to your health goals. Please review the following plan we discussed and let me know if I can assist you in the future.   Referrals/Orders/Follow-Ups/Clinician Recommendations: none  This is a list of the screening recommended for you and due dates:  Health Maintenance  Topic Date Due   Flu Shot  12/05/2022   COVID-19 Vaccine (4 - 2023-24 season) 01/05/2023   DEXA scan (bone density measurement)  10/01/2023*   Medicare Annual Wellness Visit  01/31/2024   DTaP/Tdap/Td vaccine (2 - Tdap) 05/06/2030   Pneumonia Vaccine  Completed   HPV Vaccine  Aged Out   Zoster (Shingles) Vaccine  Discontinued  *Topic was postponed. The date shown is not the original due date.    Advanced directives: (Copy Requested) Please bring a copy of your health care power of attorney and living will to the office to be added to your chart at your convenience.  Next Medicare Annual Wellness Visit scheduled for next year: Yes  Insert Preventive Care attachment Insert FALL PREVENTION attachment if needed

## 2023-01-31 NOTE — Progress Notes (Signed)
Subjective:   Frances Hogan is a 82 y.o. female who presents for an Initial Medicare Annual Wellness Visit.  Visit Complete: In person    Cardiac Risk Factors include: advanced age (>79men, >67 women);hypertension     Objective:    Today's Vitals   01/31/23 0828  BP: 130/62  Pulse: 65  Temp: 98 F (36.7 C)  TempSrc: Oral  SpO2: 95%  Weight: 140 lb (63.5 kg)  Height: 4\' 11"  (1.499 m)   Body mass index is 28.28 kg/m.     01/31/2023    8:33 AM  Advanced Directives  Does Patient Have a Medical Advance Directive? Yes  Type of Estate agent of [place];Living will  Copy of Healthcare Power of Attorney in Chart? No - copy requested    Current Medications (verified) Outpatient Encounter Medications as of 01/31/2023  Medication Sig   amLODipine (NORVASC) 5 MG tablet Take 1 tablet (5 mg total) by mouth daily.   atenolol (TENORMIN) 50 MG tablet Take 1 tablet (50 mg total) by mouth daily.   benazepril (LOTENSIN) 10 MG tablet Take 1 tablet (10 mg total) by mouth daily.   triamcinolone cream (KENALOG) 0.1 % Apply 1 Application topically 2 (two) times daily as needed. Apply topically twice a day for up to two weeks   No facility-administered encounter medications on file as of 01/31/2023.    Allergies (verified) Patient has no known allergies.   History: Past Medical History:  Diagnosis Date   Allergy    GERD (gastroesophageal reflux disease)    Hypertension    Past Surgical History:  Procedure Laterality Date   APPENDECTOMY     CHOLECYSTECTOMY     TUBAL LIGATION Bilateral    Family History  Problem Relation Age of Onset   Heart disease Mother    Diabetes Mother    Cirrhosis Mother    Heart disease Father    Kidney disease Brother    Heart disease Brother    Diabetes Brother    Social History   Socioeconomic History   Marital status: Married    Spouse name: Not on file   Number of children: 3   Years of education: Not on file    Highest education level: Not on file  Occupational History   Not on file  Tobacco Use   Smoking status: Never   Smokeless tobacco: Never  Vaping Use   Vaping status: Never Used  Substance and Sexual Activity   Alcohol use: Yes    Alcohol/week: 1.0 standard drink of alcohol    Types: 1 Glasses of wine per week    Comment: with dinner   Drug use: Never   Sexual activity: Yes  Other Topics Concern   Not on file  Social History Narrative   Moved from [place], [place].   Social Determinants of Health   Financial Resource Strain: Low Risk  (01/31/2023)   Overall Financial Resource Strain (CARDIA)    Difficulty of Paying Living Expenses: Not hard at all  Food Insecurity: No Food Insecurity (01/31/2023)   Hunger Vital Sign    Worried About Running Out of Food in the Last Year: Never true    Ran Out of Food in the Last Year: Never true  Transportation Needs: No Transportation Needs (01/31/2023)   PRAPARE - Administrator, Civil Service (Medical): No    Lack of Transportation (Non-Medical): No  Physical Activity: Insufficiently Active (01/31/2023)   Exercise Vital Sign    Days of Exercise  per Week: 1 day    Minutes of Exercise per Session: 60 min  Stress: No Stress Concern Present (01/31/2023)   [employer] of Occupational Health - Occupational Stress Questionnaire    Feeling of Stress : Not at all  Social Connections: Moderately Integrated (01/31/2023)   Social Connection and Isolation Panel [NHANES]    Frequency of Communication with Friends and Family: More than three times a week    Frequency of Social Gatherings with Friends and Family: Once a week    Attends Religious Services: More than 4 times per year    Active Member of [employer] or Organizations: No    Attends Engineer, structural: Never    Marital Status: Married    Tobacco Counseling Counseling given: Not Answered   Clinical Intake:  Pre-visit preparation completed: Yes  Pain : No/denies pain      Nutritional Status: BMI 25 -29 Overweight Nutritional Risks: None Diabetes: No  How often do you need to have someone help you when you read instructions, pamphlets, or other written materials from your doctor or pharmacy?: 1 - Never  Interpreter Needed?: No  Information entered by :: NAllen LPN   Activities of Daily Living    01/31/2023    8:29 AM  In your present state of health, do you have any difficulty performing the following activities:  Hearing? 0  Vision? 0  Difficulty concentrating or making decisions? 0  Walking or climbing stairs? 1  Comment due to knees  Dressing or bathing? 0  Doing errands, shopping? 0  Preparing Food and eating ? N  Using the Toilet? N  In the past six months, have you accidently leaked urine? N  Do you have problems with loss of bowel control? N  Managing your Medications? N  Managing your Finances? N  Housekeeping or managing your Housekeeping? N    Patient Care Team: Loyola Mast, MD as PCP - General (Family Medicine) Rodolph Bong, MD as Consulting Physician (Sports Medicine)  Indicate any recent Medical Services you may have received from other than [facility] providers in the past year (date may be approximate).     Assessment:   This is a routine wellness examination for Cait.  Hearing/Vision screen Hearing Screening - Comments:: Denies hearing issues Vision Screening - Comments:: No regular eye exams   Goals Addressed             This Visit's Progress    Patient Stated       01/31/2023, wants to lose 10 pounds       Depression Screen    01/31/2023    8:36 AM 07/02/2022    1:30 PM 03/12/2022    1:44 PM 10/20/2020    2:37 PM  PHQ 2/9 Scores  PHQ - 2 Score 0 0 0 0  PHQ- 9 Score 0       Fall Risk    01/31/2023    8:34 AM 07/02/2022    1:30 PM 03/12/2022    1:44 PM 10/20/2020    2:38 PM  Fall Risk   Falls in the past year? 1 0 0 0  Comment passed out     Number falls in past yr: 0 0 0 0  Injury with Fall?  0 0 0 0  Risk for fall due to : Medication side effect No Fall Risks No Fall Risks   Follow up Falls prevention discussed;Falls evaluation completed Falls evaluation completed Falls evaluation completed     MEDICARE RISK  AT HOME: Medicare Risk at Home Any stairs in or around the home?: Yes If so, are there any without handrails?: No Home free of loose throw rugs in walkways, pet beds, electrical cords, etc?: Yes Adequate lighting in your home to reduce risk of falls?: Yes Life alert?: No Use of a cane, walker or w/c?: No Grab bars in the bathroom?: No Shower chair or bench in shower?: No Elevated toilet seat or a handicapped toilet?: No  TIMED UP AND GO:  Was the test performed? Yes  Length of time to ambulate 10 feet: 5 sec Gait steady and fast without use of assistive device    Cognitive Function:        01/31/2023    8:36 AM  6CIT Screen  What Year? 0 points  What month? 0 points  What time? 0 points  Count back from 20 0 points  Months in reverse 2 points  Repeat phrase 4 points  Total Score 6 points    Immunizations Immunization History  Administered Date(s) Administered   Influenza-Unspecified 02/27/2021, 02/27/2022   PFIZER(Purple Top)SARS-COV-2 Vaccination 07/15/2019, 08/15/2019, 03/13/2020   PNEUMOCOCCAL CONJUGATE-20 10/20/2020   Td 05/06/2020    TDAP status: Up to date  Flu Vaccine status: Up to date  Pneumococcal vaccine status: Up to date  Covid-19 vaccine status: Completed vaccines  Qualifies for Shingles Vaccine? Yes   Zostavax completed No   Shingrix Completed?: No.    Education has been provided regarding the importance of this vaccine. Patient has been advised to call insurance company to determine out of pocket expense if they have not yet received this vaccine. Advised may also receive vaccine at local pharmacy or Health Dept. Verbalized acceptance and understanding.  Screening Tests Health Maintenance  Topic Date Due   INFLUENZA  VACCINE  12/05/2022   COVID-19 Vaccine (4 - 2023-24 season) 01/05/2023   DEXA SCAN  10/01/2023 (Originally 06/18/2004)   Medicare Annual Wellness (AWV)  01/31/2024   DTaP/Tdap/Td (2 - Tdap) 05/06/2030   Pneumonia Vaccine 73+ Years old  Completed   HPV VACCINES  Aged Out   Zoster Vaccines- Shingrix  Discontinued    Health Maintenance  Health Maintenance Due  Topic Date Due   INFLUENZA VACCINE  12/05/2022   COVID-19 Vaccine (4 - 2023-24 season) 01/05/2023    Colorectal cancer screening: No longer required.   Mammogram status: No longer required due to age.  Bone Density status: declines  Lung Cancer Screening: (Low Dose CT Chest recommended if Age 23-80 years, 20 pack-year currently smoking OR have quit w/in 15years.) does not qualify.   Lung Cancer Screening Referral: no  Additional Screening:  Hepatitis C Screening: does not qualify;   Vision Screening: Recommended annual ophthalmology exams for early detection of glaucoma and other disorders of the eye. Is the patient up to date with their annual eye exam?  No  Who is the provider or what is the name of the office in which the patient attends annual eye exams? none If pt is not established with a provider, would they like to be referred to a provider to establish care? No .   Dental Screening: Recommended annual dental exams for proper oral hygiene  Diabetic Foot Exam: n/a  Community Resource Referral / Chronic Care Management: CRR required this visit?  No   CCM required this visit?  No     Plan:     I have personally reviewed and noted the following in the patient's chart:   Medical and social  history Use of alcohol, tobacco or illicit drugs  Current medications and supplements including opioid prescriptions. Patient is not currently taking opioid prescriptions. Functional ability and status Nutritional status Physical activity Advanced directives List of other physicians Hospitalizations, surgeries, and  ER visits in previous 12 months Vitals Screenings to include cognitive, depression, and falls Referrals and appointments  In addition, I have reviewed and discussed with patient certain preventive protocols, quality metrics, and best practice recommendations. A written personalized care plan for preventive services as well as general preventive health recommendations were provided to patient.     Barb Merino, LPN   5/40/9811   After Visit Summary: (In Person-Printed) AVS printed and given to the patient  Nurse Notes: none

## 2023-02-17 ENCOUNTER — Other Ambulatory Visit: Payer: Self-pay | Admitting: Family Medicine

## 2023-02-17 DIAGNOSIS — I10 Essential (primary) hypertension: Secondary | ICD-10-CM

## 2023-03-12 ENCOUNTER — Ambulatory Visit: Payer: Medicare Other | Admitting: Family Medicine

## 2023-03-12 VITALS — BP 122/66 | HR 62 | Temp 97.9°F | Ht 59.0 in | Wt 143.6 lb

## 2023-03-12 DIAGNOSIS — I10 Essential (primary) hypertension: Secondary | ICD-10-CM | POA: Diagnosis not present

## 2023-03-12 DIAGNOSIS — L819 Disorder of pigmentation, unspecified: Secondary | ICD-10-CM

## 2023-03-12 NOTE — Assessment & Plan Note (Signed)
Blood pressure is at goal. Continue amlodipine 5 mg daily, atenolol 50 mg daily, and benazepril 10 mg daily.

## 2023-03-12 NOTE — Progress Notes (Signed)
[facility] [address] [place] [address] Dept: [phone] Dept Fax: [phone]  Chronic Care Office Visit  Subjective:    Patient ID: Frances Hogan, female    DOB: [birth year], 82 y.o..   MRN: [id number]  Chief Complaint  Patient presents with   Hypertension    F/u HTN. C/o still having rash on legs.  Was given a cream with little relief.    History of Present Illness:  Patient is in today for reassessment of chronic medical issues.  Frances Hogan has a history of hypertension. She is currently managed on amlodipine 5 mg daily, atenolol 50 mg daily, and benazepril 10 mg daily.     Frances Hogan continues to have hyperpigmentation of the lower legs. 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 She has been prescribed TAC cream, but has not found this to be helpful.  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   Essential hypertension 10/20/2020   Past Surgical History:  Procedure Laterality Date   APPENDECTOMY     CHOLECYSTECTOMY     TUBAL LIGATION Bilateral    Family History  Problem Relation Age of Onset   Heart disease Mother    Diabetes Mother    Cirrhosis Mother    Heart disease Father    Kidney disease Brother    Heart disease Brother    Diabetes Brother    Outpatient Medications Prior to Visit  Medication Sig Dispense Refill   amLODipine (NORVASC) 5 MG tablet TAKE 1 TABLET DAILY 90 tablet 3   atenolol (TENORMIN) 50 MG tablet TAKE 1 TABLET DAILY 90 tablet 3   benazepril (LOTENSIN) 10 MG tablet TAKE 1 TABLET DAILY 90 tablet 3   triamcinolone cream (KENALOG) 0.1 % Apply 1 Application topically 2 (two) times daily as needed. Apply topically twice a day  for up to two weeks 240 g 11   No facility-administered medications prior to visit.   No Known Allergies Objective:   Today's Vitals   03/12/23 1249  BP: 122/66  Pulse: 62  Temp: 97.9 F (36.6 C)  TempSrc: Temporal  SpO2: 98%  Weight: 143 lb 9.6 oz (65.1 kg)  Height: 4\' 11"  (1.499 m)   Body mass index is 29 kg/m.   General: Well developed, well nourished. No acute distress. Skin: Warm and dry. There are areas of lacy brown hyperpigmentation over the lower legs near the ankles. Psych: Alert and oriented. Normal mood and affect.  There are no preventive care reminders to display for this patient.    Assessment & Plan:   Problem List Items Addressed This Visit       Cardiovascular and Mediastinum   Essential hypertension - Primary    Blood pressure is at goal. Continue amlodipine 5 mg daily, atenolol 50 mg daily, and benazepril 10 mg daily.        Musculoskeletal and Integument   Hyperpigmentation of skin, lower legs    Although dermatology had thought this might be Schamberg's disease, I do not note any purpuric areas or petechia. This is predominantly brown hyperpigmentation. I wonder if this is more a solar lentigo, due to the presence in the lower leg near the ankle in skin that would get sun exposure over time. Frances Hogan prefers  to cover this area with hosiery when needed, rather than use lotions aimed at skin lightening/bleaching.       Return in about 6 months (around 09/09/2023) for Reassessment.   Loyola Mast, MD

## 2023-03-12 NOTE — Assessment & Plan Note (Signed)
Although dermatology had thought this might be Schamberg's disease, I do not note any purpuric areas or petechia. This is predominantly brown hyperpigmentation. I wonder if this is more a solar lentigo, due to the presence in the lower leg near the ankle in skin that would get sun exposure over time. Frances Hogan prefers to cover this area with hosiery when needed, rather than use lotions aimed at skin lightening/bleaching.

## 2023-09-15 ENCOUNTER — Ambulatory Visit (INDEPENDENT_AMBULATORY_CARE_PROVIDER_SITE_OTHER): Payer: Medicare Other | Admitting: Family Medicine

## 2023-09-15 ENCOUNTER — Encounter: Payer: Self-pay | Admitting: Family Medicine

## 2023-09-15 VITALS — BP 118/68 | HR 98 | Temp 97.6°F | Ht 59.0 in | Wt 144.0 lb

## 2023-09-15 DIAGNOSIS — I10 Essential (primary) hypertension: Secondary | ICD-10-CM

## 2023-09-15 LAB — BASIC METABOLIC PANEL WITH GFR
BUN: 12 mg/dL (ref 6–23)
CO2: 29 meq/L (ref 19–32)
Calcium: 9.6 mg/dL (ref 8.4–10.5)
Chloride: 99 meq/L (ref 96–112)
Creatinine, Ser: 0.75 mg/dL (ref 0.40–1.20)
GFR: 73.2 mL/min (ref >60.00)
Glucose, Bld: 92 mg/dL (ref 70–99)
Potassium: 4.1 meq/L (ref 3.5–5.1)
Sodium: 135 meq/L (ref 135–145)

## 2023-09-15 NOTE — Assessment & Plan Note (Signed)
 Blood pressure is at goal. Continue amlodipine  5 mg daily, atenolol  50 mg daily, and benazepril  10 mg daily. I will check annual renal labs today.

## 2023-09-15 NOTE — Progress Notes (Signed)
  [facility] [address] [place] [address] Dept: [phone] Dept Fax: [phone]  Chronic Care Office Visit  Subjective:    Patient ID: Frances Hogan, female    DOB: May 20, 1940, 83 y.o..   MRN: [id number]  Chief Complaint  Patient presents with   Hypertension   History of Present Illness:  Patient is in today for reassessment of chronic medical issues.  Ms. Bresnahan has a history of hypertension. She is currently managed on amlodipine  5 mg daily, atenolol  50 mg daily, and benazepril  10 mg daily.    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   Essential hypertension 10/20/2020   Past Surgical History:  Procedure Laterality Date   APPENDECTOMY     CHOLECYSTECTOMY     TUBAL LIGATION Bilateral    Family History  Problem Relation Age of Onset   Heart disease Mother    Diabetes Mother    Cirrhosis Mother    Heart disease Father    Kidney disease Brother    Heart disease Brother    Diabetes Brother    Outpatient Medications Prior to Visit  Medication Sig Dispense Refill   amLODipine  (NORVASC ) 5 MG tablet TAKE 1 TABLET DAILY 90 tablet 3   atenolol  (TENORMIN ) 50 MG tablet TAKE 1 TABLET DAILY 90 tablet 3   benazepril  (LOTENSIN ) 10 MG tablet TAKE 1 TABLET DAILY 90 tablet 3   triamcinolone  cream (KENALOG ) 0.1 % Apply 1 Application topically 2 (two) times daily as needed. Apply topically twice a day for up to two weeks 240 g 11   No facility-administered medications prior to visit.   No Known Allergies Objective:   Today's Vitals   09/15/23 1305  BP: 118/68  Pulse: 98  Temp: 97.6 F (36.4 C)  TempSrc: Temporal  SpO2: (!) 65%  Weight: 144 lb (65.3 kg)  Height: 4'  11" (1.499 m)   Body mass index is 29.08 kg/m.   General: Well developed, well nourished. No acute distress. Psych: Alert and oriented. Normal mood and affect.  Health Maintenance Due  Topic Date Due   COVID-19 Vaccine (5 - 2024-25 season) 07/24/2023     Assessment & Plan:   Problem List Items Addressed This Visit       Cardiovascular and Mediastinum   Essential hypertension - Primary   Blood pressure is at goal. Continue amlodipine  5 mg daily, atenolol  50 mg daily, and benazepril  10 mg daily. I will check annual renal labs today.      Relevant Orders   Basic metabolic panel with GFR    Return in about 6 months (around 03/17/2024) for Reassessment.   Graig Lawyer, MD

## 2024-02-12 ENCOUNTER — Other Ambulatory Visit: Payer: Self-pay | Admitting: Family Medicine

## 2024-02-12 DIAGNOSIS — I10 Essential (primary) hypertension: Secondary | ICD-10-CM

## 2024-03-08 ENCOUNTER — Ambulatory Visit (INDEPENDENT_AMBULATORY_CARE_PROVIDER_SITE_OTHER): Admitting: Family Medicine

## 2024-03-08 ENCOUNTER — Encounter: Payer: Self-pay | Admitting: Family Medicine

## 2024-03-08 VITALS — BP 134/72 | HR 54 | Temp 97.3°F | Ht 59.0 in | Wt 144.0 lb

## 2024-03-08 DIAGNOSIS — I10 Essential (primary) hypertension: Secondary | ICD-10-CM | POA: Diagnosis not present

## 2024-03-08 NOTE — Progress Notes (Signed)
 [facility] [address] [place] [address] Dept: [phone] Dept Fax: [phone]  Chronic Care Office Visit  Subjective:    Patient ID: Frances Hogan, female    DOB: 1940-08-15, 83 y.o..   MRN: [id number]  Chief Complaint  Patient presents with   Hypertension    F/u HTN.  No concerns.     History of Present Illness:  Patient is in today for  reassessment of chronic medical issues.   Ms. Cabal has a history of hypertension. She is currently managed on amlodipine  5 mg daily, atenolol  50 mg daily, and benazepril  10 mg daily.    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   Essential hypertension 10/20/2020   Past Surgical History:  Procedure Laterality Date   APPENDECTOMY     CHOLECYSTECTOMY     TUBAL LIGATION Bilateral    Family History  Problem Relation Age of Onset   Heart disease Mother    Diabetes Mother    Cirrhosis Mother    Heart disease Father    Kidney disease Brother    Heart disease Brother    Diabetes Brother    Outpatient Medications Prior to Visit  Medication Sig Dispense Refill   amLODipine  (NORVASC ) 5 MG tablet TAKE 1 TABLET DAILY 90 tablet 3   atenolol  (TENORMIN ) 50 MG tablet TAKE 1 TABLET DAILY 90 tablet 3   benazepril  (LOTENSIN ) 10 MG tablet TAKE 1 TABLET DAILY 90 tablet 3   triamcinolone  cream (KENALOG ) 0.1 % Apply 1 Application topically 2 (two) times daily as needed. Apply topically twice a day for up to two weeks 240 g 11   No facility-administered medications prior to visit.   No Known Allergies   Objective:   Today's Vitals   03/08/24 1244  BP: 134/72  Pulse: (!) 54  Temp: (!) 97.3 F (36.3 C)  TempSrc: Temporal  SpO2: 98%   Weight: 144 lb (65.3 kg)  Height: 4' 11 (1.499 m)   Body mass index is 29.08 kg/m.   General: Well developed, well nourished. No acute distress. Psych: Alert and oriented. Normal mood and affect.  Health Maintenance Due  Topic Date Due   Influenza Vaccine  12/05/2023   COVID-19 Vaccine (5 - 2025-26 season) 01/05/2024   Medicare Annual Wellness (AWV)  01/31/2024   Lab Results    Latest Ref Rng & Units 09/15/2023    1:39 PM 07/09/2022    9:45 AM  CMP  Glucose 70 - 99 mg/dL 92  89   BUN 6 - 23 mg/dL 12  13   Creatinine 9.59 - 1.20 mg/dL 9.24  9.28   Sodium 864 - 145 mEq/L 135  138   Potassium 3.5 - 5.1 mEq/L 4.1  4.6   Chloride 96 - 112 mEq/L 99  102   CO2 19 - 32 mEq/L 29  30   Calcium 8.4 - 10.5 mg/dL 9.6  9.7      Assessment & Plan:   Problem List Items Addressed This Visit       Cardiovascular and Mediastinum   Essential hypertension - Primary   Blood pressure is at goal. Continue amlodipine  5 mg daily, atenolol  50 mg daily, and benazepril  10 mg daily.        Return  in about 6 months (around 09/05/2024) for Reassessment.   Garnette CHRISTELLA Simpler, MD  I,Emily Lagle,acting as a scribe for Garnette CHRISTELLA Simpler, MD.,have documented all relevant documentation on the behalf of Garnette CHRISTELLA Simpler, MD.  I, Garnette CHRISTELLA Simpler, MD, have reviewed all documentation for this visit. The documentation on 03/08/2024 for the exam, diagnosis, procedures, and orders are all accurate and complete.

## 2024-03-08 NOTE — Assessment & Plan Note (Signed)
Blood pressure is at goal. Continue amlodipine 5 mg daily, atenolol 50 mg daily, and benazepril 10 mg daily.

## 2024-03-17 ENCOUNTER — Ambulatory Visit: Admitting: Family Medicine

## 2024-09-06 ENCOUNTER — Ambulatory Visit: Admitting: Family Medicine
